# Patient Record
Sex: Female | Born: 1975 | Race: White | Hispanic: No | Marital: Married | Smoking: Former smoker
Health system: Southern US, Community
[De-identification: ages and names within clinical notes are randomized; demographics above are authoritative.]

## PROBLEM LIST (undated history)

## (undated) DIAGNOSIS — U071 COVID-19: Secondary | ICD-10-CM

## (undated) DIAGNOSIS — I493 Ventricular premature depolarization: Secondary | ICD-10-CM

## (undated) DIAGNOSIS — N12 Tubulo-interstitial nephritis, not specified as acute or chronic: Secondary | ICD-10-CM

## (undated) HISTORY — DX: Tubulo-interstitial nephritis, not specified as acute or chronic: N12

## (undated) HISTORY — PX: OTHER SURGICAL HISTORY: SHX169

## (undated) HISTORY — DX: COVID-19: U07.1

## (undated) HISTORY — PX: CATARACT EXTRACTION: SUR2

---

## 1997-10-27 ENCOUNTER — Other Ambulatory Visit: Admission: RE | Admit: 1997-10-27 | Discharge: 1997-10-27 | Payer: Self-pay | Admitting: Obstetrics & Gynecology

## 2003-04-03 ENCOUNTER — Emergency Department (HOSPITAL_COMMUNITY): Admission: EM | Admit: 2003-04-03 | Discharge: 2003-04-03 | Payer: Self-pay | Admitting: Emergency Medicine

## 2007-01-20 ENCOUNTER — Ambulatory Visit (HOSPITAL_COMMUNITY): Admission: RE | Admit: 2007-01-20 | Discharge: 2007-01-20 | Payer: Self-pay | Admitting: Family Medicine

## 2007-06-26 ENCOUNTER — Ambulatory Visit (HOSPITAL_COMMUNITY): Admission: RE | Admit: 2007-06-26 | Discharge: 2007-06-26 | Payer: Self-pay | Admitting: Family Medicine

## 2013-12-21 ENCOUNTER — Ambulatory Visit (HOSPITAL_COMMUNITY)
Admission: RE | Admit: 2013-12-21 | Discharge: 2013-12-21 | Disposition: A | Discharge status: Home / Self Care | Payer: Managed Care, Other (non HMO) | Source: Ambulatory Visit | Attending: Family Medicine | Admitting: Family Medicine

## 2013-12-21 ENCOUNTER — Other Ambulatory Visit (HOSPITAL_COMMUNITY): Payer: Self-pay | Admitting: Family Medicine

## 2013-12-21 DIAGNOSIS — R059 Cough, unspecified: Secondary | ICD-10-CM

## 2013-12-21 DIAGNOSIS — R05 Cough: Secondary | ICD-10-CM

## 2013-12-21 DIAGNOSIS — R918 Other nonspecific abnormal finding of lung field: Secondary | ICD-10-CM | POA: Diagnosis not present

## 2013-12-21 DIAGNOSIS — R0602 Shortness of breath: Secondary | ICD-10-CM | POA: Diagnosis not present

## 2014-09-10 ENCOUNTER — Emergency Department (HOSPITAL_COMMUNITY)
Admission: EM | Admit: 2014-09-10 | Discharge: 2014-09-10 | Disposition: A | Discharge status: Home / Self Care | Payer: Managed Care, Other (non HMO) | Attending: Emergency Medicine | Admitting: Emergency Medicine

## 2014-09-10 ENCOUNTER — Encounter (HOSPITAL_COMMUNITY): Payer: Self-pay | Admitting: *Deleted

## 2014-09-10 ENCOUNTER — Emergency Department (HOSPITAL_COMMUNITY): Payer: Managed Care, Other (non HMO)

## 2014-09-10 DIAGNOSIS — N12 Tubulo-interstitial nephritis, not specified as acute or chronic: Secondary | ICD-10-CM | POA: Diagnosis not present

## 2014-09-10 DIAGNOSIS — R109 Unspecified abdominal pain: Secondary | ICD-10-CM

## 2014-09-10 DIAGNOSIS — Z720 Tobacco use: Secondary | ICD-10-CM | POA: Diagnosis not present

## 2014-09-10 DIAGNOSIS — R000 Tachycardia, unspecified: Secondary | ICD-10-CM | POA: Diagnosis not present

## 2014-09-10 DIAGNOSIS — R300 Dysuria: Secondary | ICD-10-CM | POA: Diagnosis present

## 2014-09-10 LAB — URINE MICROSCOPIC-ADD ON

## 2014-09-10 LAB — BASIC METABOLIC PANEL
ANION GAP: 13 (ref 5–15)
BUN: 7 mg/dL (ref 6–20)
CALCIUM: 8.4 mg/dL — AB (ref 8.9–10.3)
CO2: 20 mmol/L — ABNORMAL LOW (ref 22–32)
CREATININE: 0.95 mg/dL (ref 0.44–1.00)
Chloride: 99 mmol/L — ABNORMAL LOW (ref 101–111)
Glucose, Bld: 99 mg/dL (ref 65–99)
Potassium: 4 mmol/L (ref 3.5–5.1)
SODIUM: 132 mmol/L — AB (ref 135–145)

## 2014-09-10 LAB — CBC
HCT: 33.7 % — ABNORMAL LOW (ref 36.0–46.0)
Hemoglobin: 11.6 g/dL — ABNORMAL LOW (ref 12.0–15.0)
MCH: 31.7 pg (ref 26.0–34.0)
MCHC: 34.4 g/dL (ref 30.0–36.0)
MCV: 92.1 fL (ref 78.0–100.0)
PLATELETS: 260 10*3/uL (ref 150–400)
RBC: 3.66 MIL/uL — AB (ref 3.87–5.11)
RDW: 13.2 % (ref 11.5–15.5)
WBC: 14 10*3/uL — AB (ref 4.0–10.5)

## 2014-09-10 LAB — URINALYSIS, ROUTINE W REFLEX MICROSCOPIC
Bilirubin Urine: NEGATIVE
Glucose, UA: NEGATIVE mg/dL
KETONES UR: NEGATIVE mg/dL
NITRITE: POSITIVE — AB
PH: 6 (ref 5.0–8.0)
PROTEIN: 100 mg/dL — AB
Specific Gravity, Urine: 1.014 (ref 1.005–1.030)
Urobilinogen, UA: 0.2 mg/dL (ref 0.0–1.0)

## 2014-09-10 LAB — PREGNANCY, URINE: Preg Test, Ur: NEGATIVE

## 2014-09-10 LAB — I-STAT CG4 LACTIC ACID, ED
LACTIC ACID, VENOUS: 0.37 mmol/L — AB (ref 0.5–2.0)
Lactic Acid, Venous: 1.02 mmol/L (ref 0.5–2.0)

## 2014-09-10 LAB — I-STAT BETA HCG BLOOD, ED (MC, WL, AP ONLY): I-stat hCG, quantitative: 7.6 m[IU]/mL — ABNORMAL HIGH (ref <5)

## 2014-09-10 MED ORDER — ONDANSETRON HCL 4 MG/2ML IJ SOLN
4.0000 mg | Freq: Once | INTRAMUSCULAR | Status: DC
  Filled 2014-09-10: qty 2

## 2014-09-10 MED ORDER — OXYCODONE-ACETAMINOPHEN 5-325 MG PO TABS: ORAL_TABLET | ORAL | Status: DC

## 2014-09-10 MED ORDER — CEPHALEXIN 500 MG PO CAPS: 500.0000 mg | ORAL_CAPSULE | Freq: Four times a day (QID) | ORAL | Status: AC

## 2014-09-10 MED ORDER — DEXTROSE 5 % IV SOLN
1.0000 g | Freq: Once | INTRAVENOUS | Status: AC
  Administered 2014-09-10: 1 g via INTRAVENOUS
  Filled 2014-09-10: qty 10

## 2014-09-10 MED ORDER — ACETAMINOPHEN 325 MG PO TABS
650.0000 mg | ORAL_TABLET | Freq: Once | ORAL | Status: DC
  Filled 2014-09-10: qty 2

## 2014-09-10 MED ORDER — KETOROLAC TROMETHAMINE 30 MG/ML IJ SOLN
30.0000 mg | Freq: Once | INTRAMUSCULAR | Status: AC
  Administered 2014-09-10: 30 mg via INTRAVENOUS
  Filled 2014-09-10: qty 1

## 2014-09-10 MED ORDER — SODIUM CHLORIDE 0.9 % IV BOLUS (SEPSIS)
1000.0000 mL | Freq: Once | INTRAVENOUS | Status: AC
Start: 2014-09-10 — End: 2014-09-10
  Administered 2014-09-10: 1000 mL via INTRAVENOUS

## 2014-09-10 NOTE — ED Notes (Signed)
Patient states she has a HX of Kidney stones and states she passed one on Thursday. Patient states she thinks she may have another one. Patient states no pain at this time.

## 2014-09-10 NOTE — ED Notes (Signed)
Pt states intermittent L flank pain, chills and nausea since last week.

## 2014-09-10 NOTE — ED Provider Notes (Signed)
CSN: [id number]     Arrival date & time 09/10/14  1126 History   First MD Initiated Contact with Patient 09/10/14 1211     Chief Complaint  Patient presents with  . Flank Pain     (Consider location/radiation/quality/duration/timing/severity/associated sxs/prior Treatment) HPI   Blood pressure 133/71, pulse 124, temperature 98.9 F (37.2 C), temperature source Oral, resp. rate 16, height 5\' 6"  (1.676 m), weight 187 lb (84.823 kg), last menstrual period 08/28/2014, SpO2 100 %.  ADJOA Holly Hudson is a 39 y.o. female complaining of dysuria, dark and concentrated urine, foul-smelling urine onset 1 week ago which resolved spontaneously over the course of 3 days. She also has bilateral flank pain states that the right flank pain has improved 2 days ago and thinks she passed a kidney stone but the left-sided flank pain persists. She is taking ibuprofen for this with good relief. She denies fever, , nausea, vomiting, chest pain, palpitations, shortness of breath, diarrhea. States she has not required any intervention to passed her kidney stones in the past and does not follow with urology. Endorses chills on review of systems.    History reviewed. No pertinent past medical history. History reviewed. No pertinent past surgical history. No family history on file. Social History  Substance Use Topics  . Smoking status: Current Every Day Smoker -- 2.00 packs/day    Types: Cigarettes  . Smokeless tobacco: None  . Alcohol Use: No   OB History    No data available     Review of Systems  10 systems reviewed and found to be negative, except as noted in the HPI.   Allergies  Levaquin  Home Medications   Prior to Admission medications   Medication Sig Start Date End Date Taking? Authorizing Provider  cephALEXin (KEFLEX) 500 MG capsule Take 1 capsule (500 mg total) by mouth 4 (four) times daily. 09/10/14 10/15/14  Ashaad Gaertner, PA-C  oxyCODONE-acetaminophen (PERCOCET/ROXICET) 5-325 MG  per tablet 1 to 2 tabs PO q6hrs  PRN for pain 09/10/14   Daffney Greenly, PA-C   BP 101/75 mmHg  Pulse 99  Temp(Src) 98.9 F (37.2 C) (Oral)  Resp 18  Ht 5\' 6"  (1.676 m)  Wt 187 lb (84.823 kg)  BMI 30.20 kg/m2  SpO2 95%  LMP 08/28/2014 Physical Exam  Constitutional: She is oriented to person, place, and time. She appears well-developed and well-nourished. No distress.  HENT:  Head: Normocephalic and atraumatic.  Mouth/Throat: Oropharynx is clear and moist.  Eyes: Conjunctivae and EOM are normal. Pupils are equal, round, and reactive to light.  Neck: Normal range of motion. No JVD present. No tracheal deviation present.  Cardiovascular: Normal rate, regular rhythm and intact distal pulses.   Tachycardic  Pulmonary/Chest: Effort normal and breath sounds normal. No stridor. No respiratory distress. She has no wheezes. She has no rales. She exhibits no tenderness.  Abdominal: Soft. She exhibits no distension and no mass. There is tenderness. There is no rebound and no guarding.  Mild, diffuse tenderness to palpation with no guarding or rebound.  Murphy sign negative, no tenderness to palpation over McBurney's point, Rovsings, Psoas and obturator all negative.   Genitourinary:  Mild CVA tenderness to percussion on the right  Musculoskeletal: Normal range of motion. She exhibits no edema or tenderness.  No calf asymmetry, superficial collaterals, palpable cords, edema, Homans sign negative bilaterally.    Neurological: She is alert and oriented to person, place, and time.  Skin: Skin is warm. She is not diaphoretic.  Psychiatric:  She has a normal mood and affect.  Nursing note and vitals reviewed.   ED Course  Procedures (including critical care time)  EMERGENCY DEPARTMENT US RENAL EXAM  "Study: Limited Retroperitoneal Ultrasound of Kidneys"  INDICATIONS: Flank pain  Long and short axis of both kidneys were obtained.   PERFORMED BY: Myself  IMAGES ARCHIVED?:  Yes  LIMITATIONS: Body habitus  VIEWS USED: Long axis and Short axis   INTERPRETATION: Left  Hydronephrosis moderate   CPT Code: 4581215297 (limited retroperitoneal)    Labs Review Labs Reviewed  URINALYSIS, ROUTINE W REFLEX MICROSCOPIC (NOT AT [facility]) - Abnormal; Notable for the following:    APPearance TURBID (*)    Hgb urine dipstick MODERATE (*)    Protein, ur 100 (*)    Nitrite POSITIVE (*)    Leukocytes, UA LARGE (*)    All other components within normal limits  CBC - Abnormal; Notable for the following:    WBC 14.0 (*)    RBC 3.66 (*)    Hemoglobin 11.6 (*)    HCT 33.7 (*)    All other components within normal limits  BASIC METABOLIC PANEL - Abnormal; Notable for the following:    Sodium 132 (*)    Chloride 99 (*)    CO2 20 (*)    Calcium 8.4 (*)    All other components within normal limits  I-STAT BETA HCG BLOOD, ED ([facility], [facility], [facility] ONLY) - Abnormal; Notable for the following:    I-stat hCG, quantitative 7.6 (*)    All other components within normal limits  I-STAT CG4 LACTIC ACID, ED - Abnormal; Notable for the following:    Lactic Acid, Venous 0.37 (*)    All other components within normal limits  URINE CULTURE  URINE MICROSCOPIC-ADD ON  PREGNANCY, URINE  I-STAT CG4 LACTIC ACID, ED    Imaging Review Ct Renal Stone Study  09/10/2014   CLINICAL DATA:  Acute onset of bilateral flank pain, left greater than right. Current history of urinary tract calculi. Report  EXAM: CT ABDOMEN AND PELVIS WITHOUT CONTRAST  TECHNIQUE: Multidetector CT imaging of the abdomen and pelvis was performed following the standard protocol without IV contrast.  COMPARISON:  None.  FINDINGS: Hepatobiliary: Liver normal in size and appearance for the unenhanced technique. Normal-appearing gallbladder without calcified gallstones. No biliary ductal dilation.  Spleen: Upper normal in size measuring approximately 14.3 x 4.3 x 12.9 cm yielding a volume of 397 ml. No focal splenic parenchymal abnormality.  Accessory splenic tissue medial to the spleen just below the hilum.  Pancreas:  Normal in appearance.  No pancreatic ductal dilation.  Adrenal glands:  Normal in appearance.  Genitourinary: Mild bilateral hydroureteronephrosis, though both ureters can be followed to the bladder without evidence of an obstructing stone or mass. Perinephric edema surrounding the right kidney and right ureter. No focal parenchymal abnormality involving either kidney, allowing for the unenhanced technique. No urinary tract calculi on either side. Urinary bladder normal in appearance.  Normal-appearing uterus and ovaries without evidence of adnexal mass or free pelvic fluid.  Gastrointestinal: Stomach normal in appearance for degree of distention. Normal-appearing small bowel. Normal appearing colon with expected stool burden. Normal appendix in the right upper pelvis, though appendicoliths are noted within the distal appendix.  Ascites:  Absent.  Vascular:  Calcified plaque involving the distal abdominal aorta.  Lymphatic:  No pathologic lymphadenopathy in the abdomen or pelvis.  Other findings: Very small umbilical hernia containing fat.  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  Visualized lower thorax: Heart size normal.  Lung bases clear.  IMPRESSION: 1. Mild bilateral hydroureteronephrosis. No urinary tract calculi on either side. Specifically, no obstructing stones within either ureter. 2. Edema surrounding the right kidney and the right ureter along its course. Is there clinical evidence of urinary tract infection? 3. Minimal distal abdominal aortic atherosclerosis which is somewhat advanced for age.   Electronically Signed   By: Hulan Saas M.D.   On: 09/10/2014 15:47   I have personally reviewed and evaluated these images and lab results as part of my medical decision-making.   EKG Interpretation   Date/Time:  Saturday September 10 2014 12:55:26 EDT Ventricular Rate:  117 PR  Interval:  131 QRS Duration: 79 QT Interval:  341 QTC Calculation: 476 R Axis:   95 Text Interpretation:  Sinus tachycardia Borderline right axis deviation  Borderline abnrm T, anterolateral leads Baseline wander in lead(s) V3 V4  V5 V6 No previous ECGs available Confirmed by YAO  MD, DAVID ([id number]) on  09/10/2014 1:16:06 PM      MDM   Final diagnoses:  Left flank pain  Pyelonephritis    Filed Vitals:   09/10/14 1630 09/10/14 1700 09/10/14 1715 09/10/14 1730  BP: 120/97 101/53 106/59 101/75  Pulse: 107 100 98 99  Temp:      TempSrc:      Resp: 18     Height:      Weight:      SpO2: 98% 95% 96% 95%    Medications  ondansetron (ZOFRAN) injection 4 mg (0 mg Intravenous Hold 09/10/14 1256)  acetaminophen (TYLENOL) tablet 650 mg (0 mg Oral Hold 09/10/14 1644)  sodium chloride 0.9 % bolus 1,000 mL (0 mLs Intravenous Stopped 09/10/14 1536)  cefTRIAXone (ROCEPHIN) 1 g in dextrose 5 % 50 mL IVPB (0 g Intravenous Stopped 09/10/14 1506)  ketorolac (TORADOL) 30 MG/ML injection 30 mg (30 mg Intravenous Given 09/10/14 1642)    AVIENDHA AZBELL is a pleasant 39 y.o. female presenting with resolved UTI systems and bilateral flank pain, has history of kidney stones however she appears very comfortable. Patient is tachycardic. Afebrile and overall well appearing.  Urinalysis is consistent with infection, considering patient's history of kidney stones will obtain CT for formal evaluation.  No stone is seen. Patient will be treated for pyelonephritis. We've had an extensive discussion of return precautions patient verbalizes her understanding.  Evaluation does not show pathology that would require ongoing emergent intervention or inpatient treatment. Pt is hemodynamically stable and mentating appropriately. Discussed findings and plan with patient/guardian, who agrees with care plan. All questions answered. Return precautions discussed and outpatient follow up given.   Discharge Medication  List as of 09/10/2014  5:31 PM    START taking these medications   Details  cephALEXin (KEFLEX) 500 MG capsule Take 1 capsule (500 mg total) by mouth 4 (four) times daily., Starting 09/10/2014, Until Sat 10/15/14, Print    oxyCODONE-acetaminophen (PERCOCET/ROXICET) 5-325 MG per tablet 1 to 2 tabs PO q6hrs  PRN for pain, Print             Wynetta Emery, PA-C 09/10/14 1855  Richardean Canal, MD 09/11/14 [phone]

## 2014-09-10 NOTE — Discharge Instructions (Signed)
°  Take your antibiotics as directed and to completion. You should never have any leftover antibiotics! Push fluids and stay well hydrated.  ° °Any antibiotic use can reduce the efficacy of hormonal birth control. Please use back up method of contraception.  ° °Take percocet for breakthrough pain, do not drink alcohol, drive, care for children or do other critical tasks while taking percocet. ° °Please follow with your primary care doctor in the next 2 days for a check-up. They must obtain records for further management.  ° °Do not hesitate to return to the Emergency Department for any new, worsening or concerning symptoms.  ° ° °

## 2014-09-12 LAB — URINE CULTURE: Culture: >=100000

## 2014-09-13 ENCOUNTER — Telehealth (HOSPITAL_COMMUNITY): Payer: Self-pay | Admitting: Emergency Medicine

## 2014-09-13 NOTE — Telephone Encounter (Signed)
Post ED Visit - Positive Culture Follow-up  Culture report reviewed by antimicrobial stewardship pharmacist:  Casilda Carls, Pharm.D., BCPS  Celedonio Miyamoto, Pharm.D., BCPS  Georgina Pillion, [address].D., BCPS  Many, [place].D., BCPS, AAHIVP  Estella Husk, Pharm.D., BCPS, AAHIVP  Elder Cyphers, [address].D., BCPS  Positive Urine culture Treated with Cephalexin, organism sensitive to the same and no further patient follow-up is required at this time.  Jiles Harold 09/13/2014, 6:33 PM

## 2014-09-14 ENCOUNTER — Other Ambulatory Visit (HOSPITAL_COMMUNITY): Payer: Self-pay | Admitting: Family Medicine

## 2014-09-14 DIAGNOSIS — D72829 Elevated white blood cell count, unspecified: Secondary | ICD-10-CM

## 2015-05-04 ENCOUNTER — Emergency Department (HOSPITAL_COMMUNITY): Payer: Managed Care, Other (non HMO)

## 2015-05-04 ENCOUNTER — Emergency Department (HOSPITAL_COMMUNITY)
Admission: EM | Admit: 2015-05-04 | Discharge: 2015-05-04 | Disposition: A | Discharge status: Home / Self Care | Payer: Managed Care, Other (non HMO) | Attending: Emergency Medicine | Admitting: Emergency Medicine

## 2015-05-04 ENCOUNTER — Encounter (HOSPITAL_COMMUNITY): Payer: Self-pay

## 2015-05-04 DIAGNOSIS — Z7982 Long term (current) use of aspirin: Secondary | ICD-10-CM | POA: Diagnosis not present

## 2015-05-04 DIAGNOSIS — Z79899 Other long term (current) drug therapy: Secondary | ICD-10-CM | POA: Insufficient documentation

## 2015-05-04 DIAGNOSIS — R072 Precordial pain: Secondary | ICD-10-CM | POA: Insufficient documentation

## 2015-05-04 DIAGNOSIS — R110 Nausea: Secondary | ICD-10-CM | POA: Diagnosis not present

## 2015-05-04 DIAGNOSIS — F17210 Nicotine dependence, cigarettes, uncomplicated: Secondary | ICD-10-CM | POA: Diagnosis not present

## 2015-05-04 DIAGNOSIS — R079 Chest pain, unspecified: Secondary | ICD-10-CM

## 2015-05-04 HISTORY — DX: Ventricular premature depolarization: I49.3

## 2015-05-04 LAB — BASIC METABOLIC PANEL
Anion gap: 9 (ref 5–15)
BUN: 7 mg/dL (ref 6–20)
CALCIUM: 8.4 mg/dL — AB (ref 8.9–10.3)
CO2: 21 mmol/L — ABNORMAL LOW (ref 22–32)
Chloride: 107 mmol/L (ref 101–111)
Creatinine, Ser: 0.74 mg/dL (ref 0.44–1.00)
GFR calc Af Amer: >60 mL/min (ref >60)
GLUCOSE: 115 mg/dL — AB (ref 65–99)
Potassium: 3.5 mmol/L (ref 3.5–5.1)
SODIUM: 137 mmol/L (ref 135–145)

## 2015-05-04 LAB — CBC WITH DIFFERENTIAL/PLATELET
BASOS ABS: 0 10*3/uL (ref 0.0–0.1)
Basophils Relative: 0 %
EOS ABS: 0.1 10*3/uL (ref 0.0–0.7)
EOS PCT: 1 %
HCT: 34.5 % — ABNORMAL LOW (ref 36.0–46.0)
Hemoglobin: 12 g/dL (ref 12.0–15.0)
LYMPHS ABS: 3.6 10*3/uL (ref 0.7–4.0)
LYMPHS PCT: 35 %
MCH: 31.9 pg (ref 26.0–34.0)
MCHC: 34.8 g/dL (ref 30.0–36.0)
MCV: 91.8 fL (ref 78.0–100.0)
MONO ABS: 0.5 10*3/uL (ref 0.1–1.0)
Monocytes Relative: 4 %
Neutro Abs: 6.2 10*3/uL (ref 1.7–7.7)
Neutrophils Relative %: 60 %
PLATELETS: 264 10*3/uL (ref 150–400)
RBC: 3.76 MIL/uL — ABNORMAL LOW (ref 3.87–5.11)
RDW: 13.4 % (ref 11.5–15.5)
WBC: 10.3 10*3/uL (ref 4.0–10.5)

## 2015-05-04 LAB — TROPONIN I
Troponin I: <0.03 ng/mL (ref <0.031)
Troponin I: <0.03 ng/mL (ref <0.031)

## 2015-05-04 LAB — D-DIMER, QUANTITATIVE: D-Dimer, Quant: 0.4 ug/mL-FEU (ref 0.00–0.50)

## 2015-05-04 NOTE — Discharge Instructions (Signed)
Workup for the chest pain without any acute findings. Make appointment to follow-up with your record Dr. Return for any new or worse symptoms.

## 2015-05-04 NOTE — ED Provider Notes (Signed)
CSN: [id number]     Arrival date & time 05/04/15  1539 History   First MD Initiated Contact with Patient 05/04/15 1557     Chief Complaint  Patient presents with  . Chest Pain     (Consider location/radiation/quality/duration/timing/severity/associated sxs/prior Treatment) Patient is a 40 y.o. female presenting with chest pain. The history is provided by the patient.  Chest Pain Associated symptoms: nausea and shortness of breath   Associated symptoms: no abdominal pain, no back pain, no fever and no headache    patient was at work had acute onset of chest pain sternal area described more as tightness. Now almost completely gone. Started at 2:00 in the afternoon. Oxygen saturation on room air 98%. Associated with nausea shortness of breath no vomiting. No known history of any acute cardiac problems other than a history of premature ventricular contractions and a history of a slightly leaky heart valve.  Past Medical History  Diagnosis Date  . PVC (premature ventricular contraction)    History reviewed. No pertinent past surgical history. No family history on file. Social History  Substance Use Topics  . Smoking status: Current Every Day Smoker -- 2.00 packs/day    Types: Cigarettes  . Smokeless tobacco: None  . Alcohol Use: No   OB History    No data available     Review of Systems  Constitutional: Negative for fever.  HENT: Negative for congestion.   Eyes: Negative for visual disturbance.  Respiratory: Positive for shortness of breath.   Cardiovascular: Positive for chest pain.  Gastrointestinal: Positive for nausea. Negative for abdominal pain.  Genitourinary: Negative for dysuria.  Musculoskeletal: Negative for back pain.  Skin: Negative for rash.  Neurological: Negative for headaches.  Hematological: Does not bruise/bleed easily.  Psychiatric/Behavioral: Negative for confusion.      Allergies  Levaquin  Home Medications   Prior to Admission medications    Medication Sig Start Date End Date Taking? Authorizing Provider  aspirin EC 81 MG tablet Take 162 mg by mouth once as needed for mild pain or moderate pain.   Yes Historical Provider, MD  Multiple Vitamin (MULTIVITAMIN WITH MINERALS) TABS tablet Take 1 tablet by mouth daily.   Yes Historical Provider, MD  SALINE NASAL MIST NA Place 1 spray into the nose daily as needed (for congestion).   Yes Historical Provider, MD   BP 128/85 mmHg  Pulse 91  Temp(Src) 98.1 F (36.7 C) (Oral)  Resp 16  Ht 5\' 6"  (1.676 m)  Wt 86.183 kg  BMI 30.68 kg/m2  SpO2 97%  LMP 04/20/2015 Physical Exam  Constitutional: She is oriented to person, place, and time. She appears well-developed and well-nourished. No distress.  HENT:  Head: Normocephalic and atraumatic.  Mouth/Throat: Oropharynx is clear and moist.  Eyes: Conjunctivae and EOM are normal. Pupils are equal, round, and reactive to light.  Neck: Normal range of motion. Neck supple.  Cardiovascular: Normal rate, regular rhythm and normal heart sounds.   No murmur heard. Pulmonary/Chest: Effort normal and breath sounds normal. No respiratory distress.  Abdominal: Soft. Bowel sounds are normal. There is no tenderness.  Musculoskeletal: Normal range of motion. She exhibits no edema.  Neurological: She is alert and oriented to person, place, and time. No cranial nerve deficit. She exhibits normal muscle tone. Coordination normal.  Skin: Skin is warm. No rash noted.    ED Course  Procedures (including critical care time) Labs Review Labs Reviewed  CBC WITH DIFFERENTIAL/PLATELET - Abnormal; Notable for the following:  RBC 3.76 (*)    HCT 34.5 (*)    All other components within normal limits  BASIC METABOLIC PANEL - Abnormal; Notable for the following:    CO2 21 (*)    Glucose, Bld 115 (*)    Calcium 8.4 (*)    All other components within normal limits  TROPONIN I  D-DIMER, QUANTITATIVE (NOT AT [facility])  TROPONIN I    Imaging Review Dg Chest  Portable 1 View  05/04/2015  CLINICAL DATA:  Chest pain, shortness of Breath EXAM: PORTABLE CHEST 1 VIEW COMPARISON:  12/21/2013 FINDINGS: Cardiomediastinal silhouette is unremarkable. No acute infiltrate or pleural effusion. No pulmonary edema. Bony thorax is unremarkable. IMPRESSION: No active disease. Electronically Signed   By: Natasha Mead M.D.   On: 05/04/2015 16:07   I have personally reviewed and evaluated these images and lab results as part of my medical decision-making.   EKG Interpretation   Date/Time:  Thursday May 04 2015 15:48:26 EDT Ventricular Rate:  103 PR Interval:  138 QRS Duration: 98 QT Interval:  336 QTC Calculation: 440 R Axis:   86 Text Interpretation:  Sinus tachycardia Borderline T abnormalities,  anterior leads Baseline wander in lead(s) V6 No significant change since  last tracing Confirmed by Dagoberto Nealy  MD, Jos Cygan [phone]) on 05/04/2015  4:04:56 PM      MDM   Final diagnoses:  Chest pain, unspecified chest pain type    Workup for the chest pain without any acute findings. Troponins 2 are negative. Chest x-ray negative for pneumonia pneumothorax or pulmonary edema. D-dimer also negative no real concerns for pulmonary embolus. Patient's symptoms have improved significantly. Will discharge home with follow-up with her regular doctor.  Patient's cardiac score score shows low probability.   Vanetta Mulders, MD 05/04/15 [phone]

## 2015-05-04 NOTE — ED Notes (Signed)
Pt states understanding of care given and follow up instructions.  Ambulated from ED  

## 2015-05-04 NOTE — ED Notes (Signed)
Pt reports chest pain, sob, and feeling like heart racing for the past hour.  Reports was at work walking around when it started.  Denies doing anything strenuous but was feeling " a little stressed."

## 2018-10-14 ENCOUNTER — Other Ambulatory Visit: Payer: Self-pay

## 2018-10-14 DIAGNOSIS — Z20822 Contact with and (suspected) exposure to covid-19: Secondary | ICD-10-CM

## 2018-10-16 LAB — NOVEL CORONAVIRUS, NAA: SARS-CoV-2, NAA: DETECTED — AB

## 2019-04-03 ENCOUNTER — Ambulatory Visit
Admission: EM | Admit: 2019-04-03 | Discharge: 2019-04-03 | Disposition: A | Discharge status: Home / Self Care | Payer: Commercial Managed Care - PPO | Attending: Emergency Medicine | Admitting: Emergency Medicine

## 2019-04-03 ENCOUNTER — Other Ambulatory Visit: Payer: Self-pay

## 2019-04-03 DIAGNOSIS — N3001 Acute cystitis with hematuria: Secondary | ICD-10-CM

## 2019-04-03 LAB — POCT URINALYSIS DIP (MANUAL ENTRY)
Bilirubin, UA: NEGATIVE
Glucose, UA: NEGATIVE mg/dL
Ketones, POC UA: NEGATIVE mg/dL
Nitrite, UA: NEGATIVE
Spec Grav, UA: 1.01 (ref 1.010–1.025)
Urobilinogen, UA: 0.2 E.U./dL
pH, UA: 6.5 (ref 5.0–8.0)

## 2019-04-03 MED ORDER — PHENAZOPYRIDINE HCL 100 MG PO TABS: 100.0000 mg | ORAL_TABLET | Freq: Three times a day (TID) | ORAL | 0 refills | Status: DC | PRN

## 2019-04-03 MED ORDER — NITROFURANTOIN MONOHYD MACRO 100 MG PO CAPS: 100.0000 mg | ORAL_CAPSULE | Freq: Two times a day (BID) | ORAL | 0 refills | Status: DC

## 2019-04-03 NOTE — ED Provider Notes (Signed)
[facility]   CC: Burning with urination  SUBJECTIVE:  Holly Hudson is a 44 y.o. female who complains of urinary frequency, urgency and dysuria for the past 2 days days.  Patient denies a precipitating event, recent sexual encounter, excessive caffeine intake.  Localizes the mild pain to her back.  Pain is intermittent and describes it as achy.  Has tried OTC medications without relief.  Symptoms are made worse with urination.  Admits to similar symptoms in the past.  Denies fever, chills, nausea, vomiting, abdominal pain, flank pain, abnormal vaginal discharge or bleeding, hematuria.    LMP: Patient's last menstrual period was 03/14/2019.  ROS: As in HPI.  All other pertinent ROS negative.     Past Medical History:  Diagnosis Date  . PVC (premature ventricular contraction)    History reviewed. No pertinent surgical history. Allergies  Allergen Reactions  . Levaquin [Levofloxacin In D5w] Other (See Comments)    Joint/muscle pain   No current facility-administered medications on file prior to encounter.   Current Outpatient Medications on File Prior to Encounter  Medication Sig Dispense Refill  . aspirin EC 81 MG tablet Take 162 mg by mouth once as needed for mild pain or moderate pain.    . Multiple Vitamin (MULTIVITAMIN WITH MINERALS) TABS tablet Take 1 tablet by mouth daily.    Marland Kitchen SALINE NASAL MIST NA Place 1 spray into the nose daily as needed (for congestion).     Social History   Socioeconomic History  . Marital status: Married    Spouse name: Not on file  . Number of children: Not on file  . Years of education: Not on file  . Highest education level: Not on file  Occupational History  . Not on file  Tobacco Use  . Smoking status: Current Every Day Smoker    Packs/day: 1.50    Types: Cigarettes  . Smokeless tobacco: Never Used  Substance and Sexual Activity  . Alcohol use: Yes    Comment: occ  . Drug use: No  . Sexual activity: Yes    Birth  control/protection: None  Other Topics Concern  . Not on file  Social History Narrative  . Not on file   Social Determinants of Health   Financial Resource Strain:   . Difficulty of Paying Living Expenses:   Food Insecurity:   . Worried About Charity fundraiser in the Last Year:   . Arboriculturist in the Last Year:   Transportation Needs:   . Film/video editor (Medical):   Marland Kitchen Lack of Transportation (Non-Medical):   Physical Activity:   . Days of Exercise per Week:   . Minutes of Exercise per Session:   Stress:   . Feeling of Stress :   Social Connections:   . Frequency of Communication with Friends and Family:   . Frequency of Social Gatherings with Friends and Family:   . Attends Religious Services:   . Active Member of Clubs or Organizations:   . Attends Archivist Meetings:   Marland Kitchen Marital Status:   Intimate Partner Violence:   . Fear of Current or Ex-Partner:   . Emotionally Abused:   Marland Kitchen Physically Abused:   . Sexually Abused:    Family History  Problem Relation Age of Onset  . Healthy Mother   . Healthy Father     OBJECTIVE:  Vitals:   04/03/19 0850  BP: 113/76  Pulse: (!) 103  Resp: 17  Temp: 97.9  F (36.6 C)  TempSrc: Oral  SpO2: 98%   General appearance: AOx3 in no acute distress HEENT: NCAT.  Oropharynx clear.  Lungs: clear to auscultation bilaterally without adventitious breath sounds Heart: Tachycardia and normal rhythm.  Radial pulses 2+ symmetrical bilaterally Abdomen: soft; non-distended; no tenderness; bowel sounds present; no guarding or rebound tenderness Back: no CVA tenderness Extremities: no edema; symmetrical with no gross deformities Skin: warm and dry Neurologic: Ambulates from chair to exam table without difficulty Psychological: alert and cooperative; normal mood and affect  Labs Reviewed  POCT URINALYSIS DIP (MANUAL ENTRY) - Abnormal; Notable for the following components:      Result Value   Clarity, UA cloudy (*)     Blood, UA large (*)    Protein Ur, POC trace (*)    Leukocytes, UA Large (3+) (*)    All other components within normal limits    ASSESSMENT & PLAN:  1. Acute cystitis with hematuria     Meds ordered this encounter  Medications  . nitrofurantoin, macrocrystal-monohydrate, (MACROBID) 100 MG capsule    Sig: Take 1 capsule (100 mg total) by mouth 2 (two) times daily.    Dispense:  10 capsule    Refill:  0  . phenazopyridine (PYRIDIUM) 100 MG tablet    Sig: Take 1 tablet (100 mg total) by mouth 3 (three) times daily as needed for pain.    Dispense:  10 tablet    Refill:  0   Patient stable at discharge. POCT urine analysis show large amount of blood with large amount of leukocyte suggesting possible UTI.  Discharge instruction Urine culture sent.  We will call you with the results.   Push fluids and get plenty of rest.   Take antibiotic as directed and to completion Take pyridium as prescribed and as needed for symptomatic relief Follow up with PCP if symptoms persists Return here or go to ER if you have any new or worsening symptoms such as fever, worsening abdominal pain, nausea/vomiting, flank pain, etc...  Outlined signs and symptoms indicating need for more acute intervention. Patient verbalized understanding. After Visit Summary given.     Durward Parcel, FNP 04/03/19 [phone]

## 2019-04-03 NOTE — ED Triage Notes (Signed)
Patient states that she has dysuria, urgency with urination x 2 days, hx of uti

## 2019-04-03 NOTE — Discharge Instructions (Signed)
Urine culture sent.  We will call you with the results.   Push fluids and get plenty of rest.   Take antibiotic as directed and to completion Take pyridium as prescribed and as needed for symptomatic relief Follow up with PCP if symptoms persists Return here or go to ER if you have any new or worsening symptoms such as fever, worsening abdominal pain, nausea/vomiting, flank pain, etc...  Outlined signs and symptoms indicating need for more acute intervention. Patient verbalized understanding. After Visit Summary given.  

## 2019-11-19 ENCOUNTER — Telehealth: Payer: Self-pay | Admitting: *Deleted

## 2019-11-19 NOTE — Telephone Encounter (Signed)
NOTES ON FILE FROM [facility] [phone]. REFERRAL SENT TO SCHEDULING.

## 2020-02-09 ENCOUNTER — Ambulatory Visit: Payer: Commercial Managed Care - PPO | Admitting: Cardiology

## 2020-03-24 ENCOUNTER — Encounter: Payer: Self-pay | Admitting: Cardiology

## 2020-03-24 NOTE — Progress Notes (Signed)
Cardiology Office Note  Date: 03/27/2020   ID: Holly Hudson, DOB [birth year], MRN [id number]  PCP:  Edsel Petrin, DO  Cardiologist:  Nona Dell, MD Electrophysiologist:  None   Chief Complaint  Patient presents with  . Cardiac evaluation    History of Present Illness: Holly Hudson is a 45 y.o. female referred for cardiology consultation by Ms.Chrzanowski NP for cardiac evaluation with family history of heart disease.  She does not report any active exertional symptoms, no unusual shortness of breath or chest pain, no palpitations or syncope.  She did have COVID-19 in September 2020 although has recuperated without obvious incident.  She does have a family history of early cardiac disease as outlined below.  She states that lipids have been followed by her gynecologist, it also sounds like CRP levels have been checked and within normal range.  At present I do not have access to her outside lab work.  I personally reviewed her ECG today which shows normal sinus rhythm.  Past Medical History:  Diagnosis Date  . COVID-10 October 2018  . Pyelonephritis     Past Surgical History:  Procedure Laterality Date  . No prior surgery      Current Outpatient Medications  Medication Sig Dispense Refill  . aspirin EC 81 MG tablet Take 162 mg by mouth once as needed for mild pain or moderate pain.    . Multiple Vitamin (MULTIVITAMIN WITH MINERALS) TABS tablet Take 1 tablet by mouth daily.     No current facility-administered medications for this visit.   Allergies:  Levaquin [levofloxacin in d5w]   Social History: The patient  reports that she has quit smoking. Her smoking use included cigarettes. She has never used smokeless tobacco. She reports previous alcohol use. She reports that she does not use drugs.   Family History: The patient's family history includes COPD in her father; Diabetes Mellitus II in her mother; Heart disease in her maternal  grandfather, maternal grandmother, and mother; Hypertension in her father, maternal grandfather, maternal grandmother, and mother; Lung cancer in her father.   ROS: No palpitations or syncope.  Physical Exam: VS:  BP 110/78   Pulse 86   Ht 5\' 6"  (1.676 m)   Wt 191 lb (86.6 kg)   SpO2 96%   BMI 30.83 kg/m , BMI Body mass index is 30.83 kg/m.  Wt Readings from Last 3 Encounters:  03/27/20 191 lb (86.6 kg)  05/04/15 190 lb (86.2 kg)  09/10/14 187 lb (84.8 kg)    General: Patient appears comfortable at rest. HEENT: Conjunctiva and lids normal, wearing a mask. Neck: Supple, no elevated JVP or carotid bruits, no thyromegaly. Lungs: Clear to auscultation, nonlabored breathing at rest. Cardiac: Regular rate and rhythm, no S3 or significant systolic murmur, no pericardial rub. Abdomen: Soft, nontender, bowel sounds present. Extremities: No pitting edema, distal pulses 2+. Skin: Warm and dry. Musculoskeletal: No kyphosis. Neuropsychiatric: Alert and oriented x3, affect grossly appropriate.  ECG:  An ECG dated 05/04/2015 was personally reviewed today and demonstrated:  Sinus tachycardia with nonspecific T wave changes.  Recent Labwork:  No recent lab work for review today.  Other Studies Reviewed Today:  Chest x-ray 05/04/2015: FINDINGS: Cardiomediastinal silhouette is unremarkable. No acute infiltrate or pleural effusion. No pulmonary edema. Bony thorax is unremarkable.  IMPRESSION: No active disease.  Assessment and Plan:  Cardiac risk evaluation in a 45 year old woman with family history of early heart disease.  She reports no active  exertional symptomatology and baseline ECG is normal.  Plan to request her fasting lipid profile from gynecologist for review.  We will order a coronary calcium score as well.  She quit smoking 3 years ago and I congratulated her on this.  Blood pressure is normal today as well.  Plan to review test results and make further  recommendations.  Medication Adjustments/Labs and Tests Ordered: Current medicines are reviewed at length with the patient today.  Concerns regarding medicines are outlined above.   Tests Ordered: Orders Placed This Encounter  Procedures  . CT CARDIAC SCORING (SELF PAY ONLY)  . EKG 12-Lead    Medication Changes: No orders of the defined types were placed in this encounter.   Disposition:  Follow up test results.  Signed, Jonelle Sidle, MD, [facility] 03/27/2020 3:23 PM    [facility] [address] [place], [place] [address] Phone: [phone]; Fax: [phone]

## 2020-03-27 ENCOUNTER — Encounter: Payer: Self-pay | Admitting: Cardiology

## 2020-03-27 ENCOUNTER — Encounter: Payer: Self-pay | Admitting: *Deleted

## 2020-03-27 ENCOUNTER — Ambulatory Visit (INDEPENDENT_AMBULATORY_CARE_PROVIDER_SITE_OTHER): Payer: Commercial Managed Care - PPO | Admitting: Cardiology

## 2020-03-27 ENCOUNTER — Other Ambulatory Visit: Payer: Self-pay

## 2020-03-27 VITALS — BP 110/78 | HR 86 | Ht 66.0 in | Wt 191.0 lb

## 2020-03-27 DIAGNOSIS — Z8249 Family history of ischemic heart disease and other diseases of the circulatory system: Secondary | ICD-10-CM

## 2020-03-27 NOTE — Patient Instructions (Signed)
Medication Instructions:  Your physician recommends that you continue on your current medications as directed. Please refer to the Current Medication list given to you today.  *If you need a refill on your cardiac medications before your next appointment, please call your pharmacy*   Lab Work: NONE   If you have labs (blood work) drawn today and your tests are completely normal, you will receive your results only by: Marland Kitchen MyChart Message (if you have MyChart) OR . A paper copy in the mail If you have any lab test that is abnormal or we need to change your treatment, we will call you to review the results.   Testing/Procedures: Calcium Scoring    Follow-Up: At [facility], you and your health needs are our priority.  As part of our continuing mission to provide you with exceptional heart care, we have created designated Provider Care Teams.  These Care Teams include your primary Cardiologist (physician) and Advanced Practice Providers (APPs -  Physician Assistants and Nurse Practitioners) who all work together to provide you with the care you need, when you need it.  We recommend signing up for the patient portal called "MyChart".  Sign up information is provided on this After Visit Summary.  MyChart is used to connect with patients for Virtual Visits (Telemedicine).  Patients are able to view lab/test results, encounter notes, upcoming appointments, etc.  Non-urgent messages can be sent to your provider as well.   To learn more about what you can do with MyChart, go to [email or web address].    Your next appointment:    Pending test results   The format for your next appointment:   In Person  Provider:   Nona Dell, MD   Other Instructions Thank you for choosing [facility]!

## 2020-04-04 ENCOUNTER — Telehealth: Payer: Self-pay | Admitting: *Deleted

## 2020-04-04 NOTE — Telephone Encounter (Signed)
Received fax from [facility] stating that they will not release recent pt labs without a release of information. Pt called and asked to stop by office to sign. Pt states she would stop by on tomorrow. Phone [phone] Fax: [phone].

## 2020-04-13 ENCOUNTER — Inpatient Hospital Stay: Admission: RE | Admit: 2020-04-13 | Payer: Self-pay | Source: Ambulatory Visit

## 2020-04-20 ENCOUNTER — Ambulatory Visit (INDEPENDENT_AMBULATORY_CARE_PROVIDER_SITE_OTHER)
Admission: RE | Admit: 2020-04-20 | Discharge: 2020-04-20 | Disposition: A | Discharge status: Home / Self Care | Payer: Self-pay | Source: Ambulatory Visit | Attending: Cardiology | Admitting: Cardiology

## 2020-04-20 ENCOUNTER — Other Ambulatory Visit: Payer: Self-pay

## 2020-04-20 DIAGNOSIS — Z8249 Family history of ischemic heart disease and other diseases of the circulatory system: Secondary | ICD-10-CM

## 2020-04-26 ENCOUNTER — Ambulatory Visit: Payer: Commercial Managed Care - PPO | Admitting: Student

## 2021-12-12 IMAGING — CT CT CARDIAC CORONARY ARTERY CALCIUM SCORE
3 series · 14 of 20 positions shown, 15 images · non-contrast
Comparison: 05/04/2015 chest radiograph.
COMPARISON: 05/04/2015 chest radiograph.

Addendum:
EXAM:
OVER-READ INTERPRETATION  CT CHEST

The following report is an over-read performed by radiologist Dr.
Heyber Restepo [REDACTED] on 04/20/2020. This over-read
does not include interpretation of cardiac or coronary anatomy or
pathology. The calcium score interpretation by the cardiologist is
attached.
CLINICAL DATA: Cardiovascular Disease Risk stratification
Coronary Calcium Score
TECHNIQUE: A gated, non-contrast computed tomography scan of the heart was
performed using 3mm slice thickness. Axial images were analyzed on a
dedicated workstation. Calcium scoring of the coronary arteries was
performed using the Agatston method.

[Series 2: casc 3.0 bv41 2 bestdiast 72 % · axial · 0.39mm/px · z∈[-160,-88]mm · 4 of 41 slices shown, 5 images]
[im 9/41  vessel]
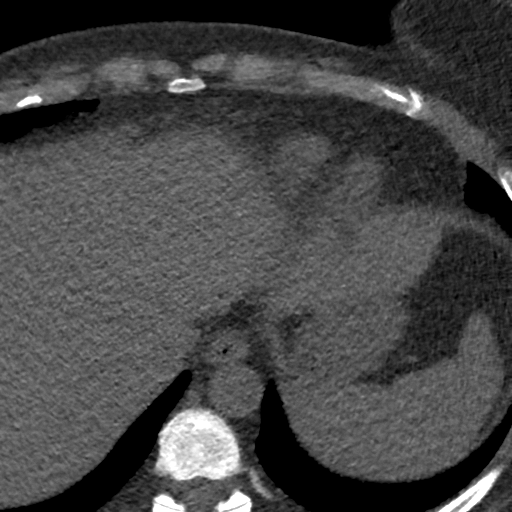
[im 9/41  lung]
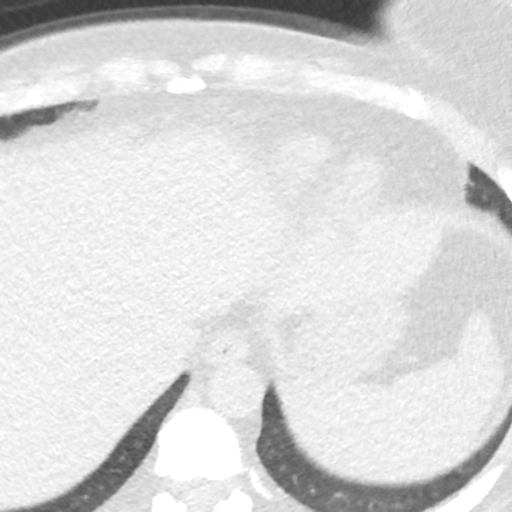
[im 17/41  vessel]
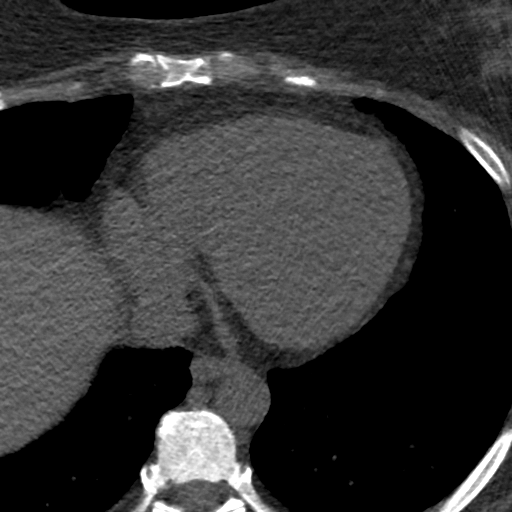
[im 25/41  vessel]
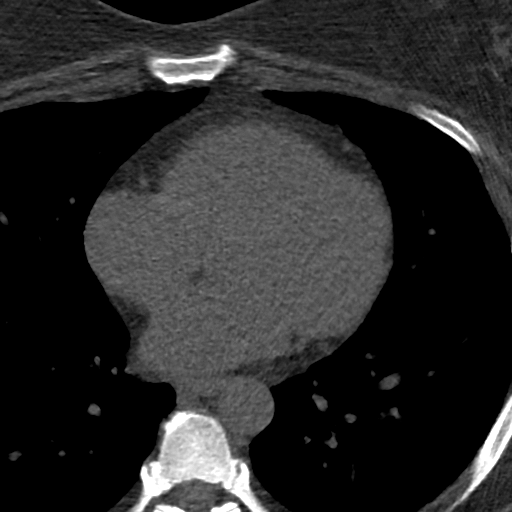
[im 33/41  vessel]
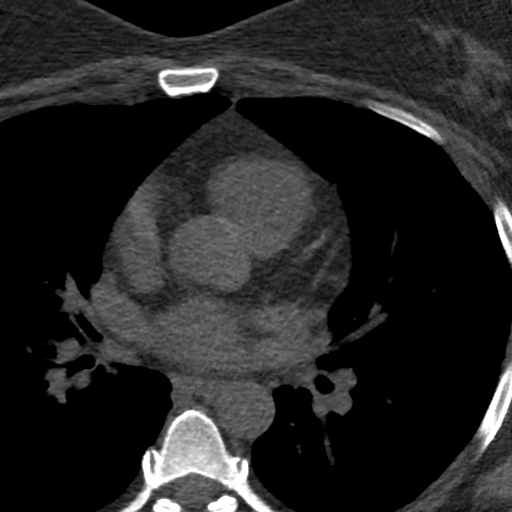

[Series 3: lung 72 % · axial · 0.65mm/px · z∈[-166,-86]mm · 5 of 41 slices shown]
[im 7/41  lung]
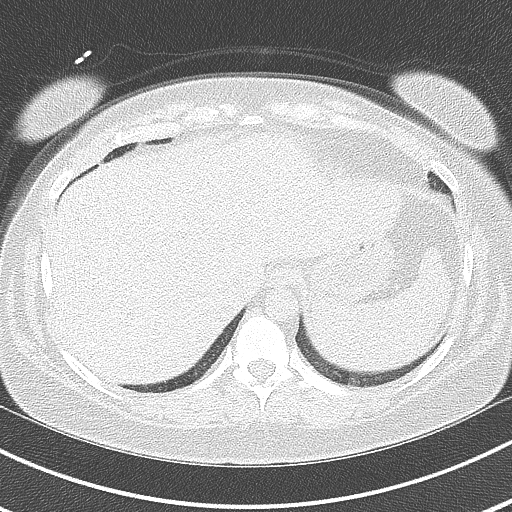
[im 14/41  lung]
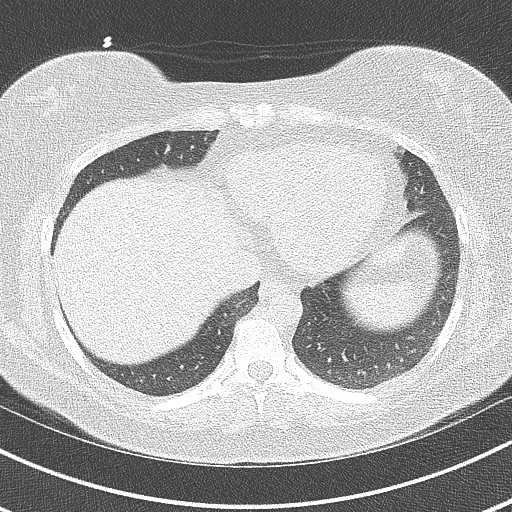
[im 21/41  lung]
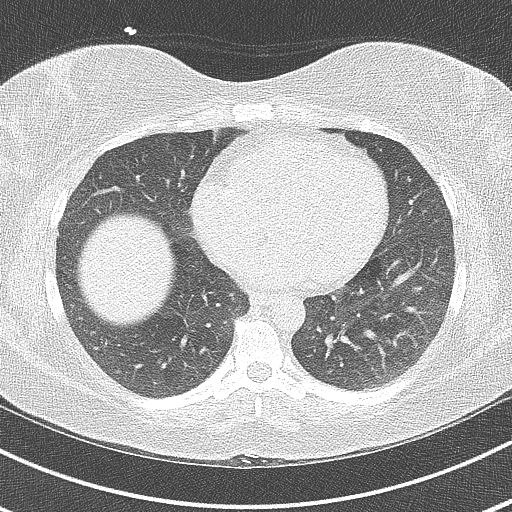
[im 27/41  lung]
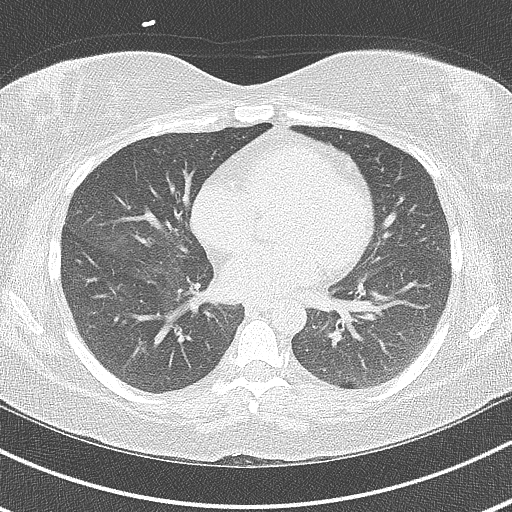
[im 34/41  lung]
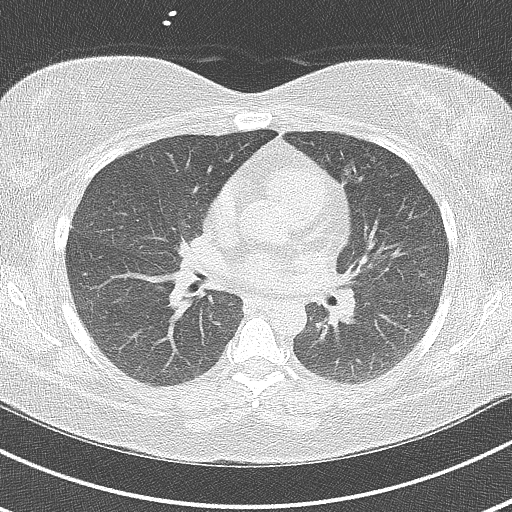

[Series 4: lung st 72 % · axial · 0.65mm/px · z∈[-166,-86]mm · 5 of 41 slices shown]
[im 7/41  lung]
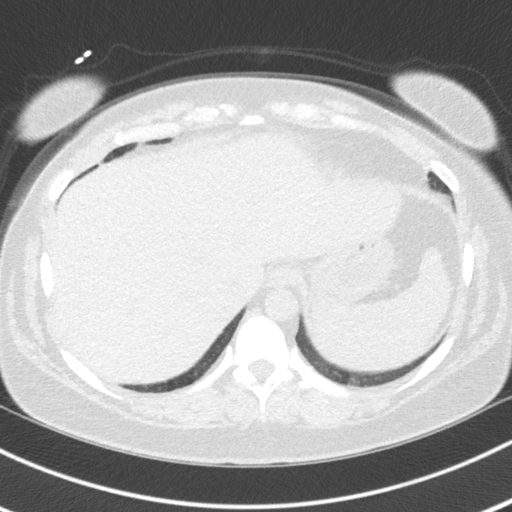
[im 14/41  lung]
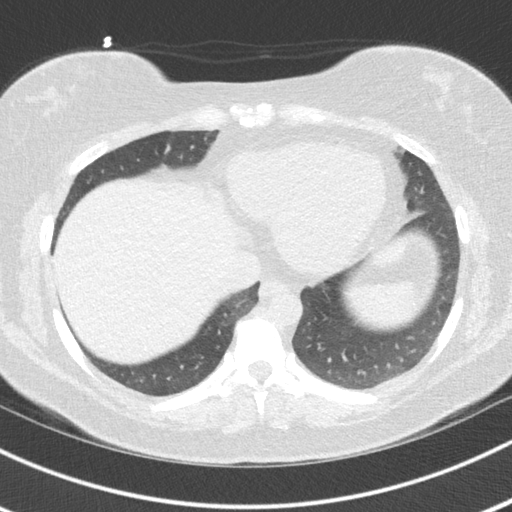
[im 21/41  lung]
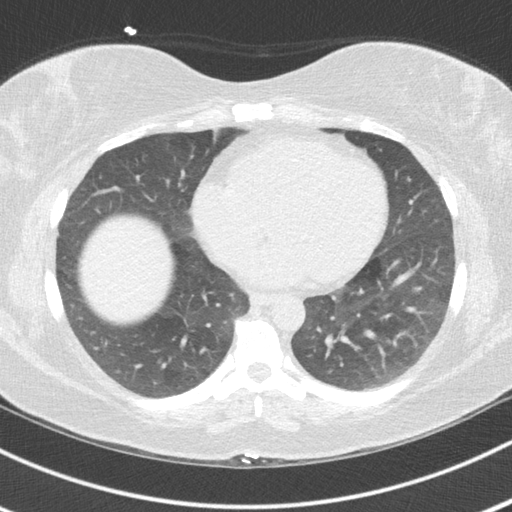
[im 27/41  lung]
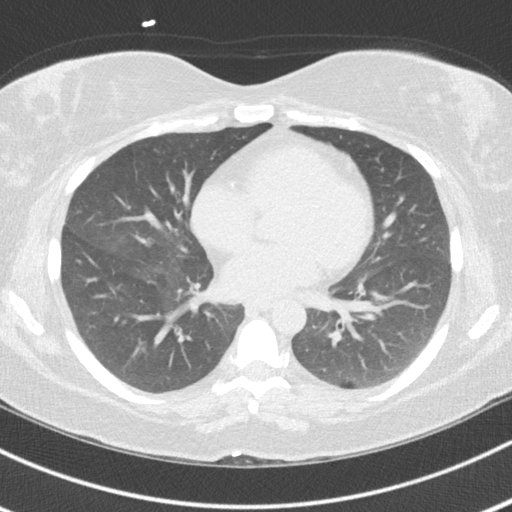
[im 34/41  lung]
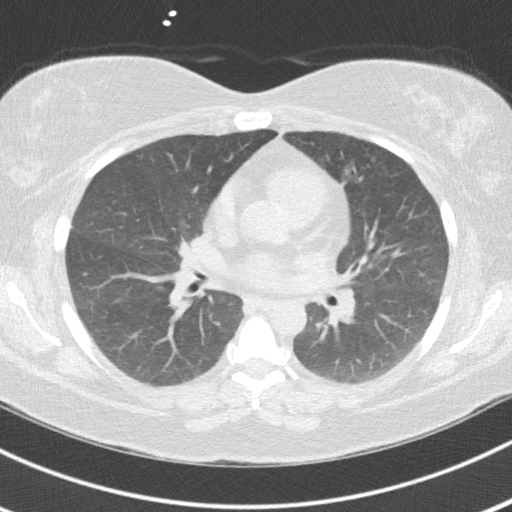

[14 of 20 positions shown; findings below may reference images not displayed]

FINDINGS: Vascular: Normal aortic caliber.

Mediastinum/Nodes: No imaged thoracic adenopathy.

Lungs/Pleura: No pleural fluid. Bilateral upper lobe mild
peribronchovascular interstitial thickening, including on [DATE].

Upper Abdomen: Normal imaged portions of the liver, spleen, stomach.

Musculoskeletal: No acute osseous abnormality.
IMPRESSION: 1.  No acute findings in the imaged extracardiac chest.
2. Mild bilateral upper lobe peribronchovascular interstitial
thickening. Most likely post infectious/inflammatory scarring. If
the patient has chronic shortness of breath, dedicated
high-resolution chest CT may be informative.
FINDINGS: Coronary arteries: Normal origins.

Coronary Calcium Score:

Left main: 0

Left anterior descending artery: 0

Left circumflex artery: 0

Right coronary artery: 0

Total: 0

Percentile: 0

Pericardium: Normal.

Ascending Aorta: Normal caliber.

Non-cardiac: See separate report from [REDACTED].
IMPRESSION: Coronary calcium score of 0. This is a low risk study.



If CAC=0, it is reasonable to withhold statin therapy and reassess
in 5 to 10 years, as long as higher risk conditions are absent
(diabetes mellitus, family history of premature CHD in first degree
relatives (males <55 years; females <65 years), cigarette smoking,
or LDL >=190 mg/dL).

If CAC is 1 to 99, it is reasonable to initiate statin therapy for
patients >=55 years of age.

If CAC is >=100 or >=75th percentile, it is reasonable to initiate
statin therapy at any age.

Cardiology referral should be considered for patients with CAC
scores >=400 or >=75th percentile.

*0541 AHA/ACC/AACVPR/AAPA/ABC/LIENAD/WADDELL/ERXLEBEN/Sri/MANOHARAN/LAMBROU/GAL
Guideline on the Management of Blood Cholesterol: A Report of the
American College of Cardiology/American Heart Association Task Force
on Clinical Practice Guidelines. J Am Coll Cardiol.
7880;73(24):5589-5707.

*** End of Addendum ***
EXAM:
OVER-READ INTERPRETATION  CT CHEST

The following report is an over-read performed by radiologist Dr.
Heyber Restepo [REDACTED] on 04/20/2020. This over-read
does not include interpretation of cardiac or coronary anatomy or
pathology. The calcium score interpretation by the cardiologist is
attached.
FINDINGS: Vascular: Normal aortic caliber.

Mediastinum/Nodes: No imaged thoracic adenopathy.

Lungs/Pleura: No pleural fluid. Bilateral upper lobe mild
peribronchovascular interstitial thickening, including on [DATE].

Upper Abdomen: Normal imaged portions of the liver, spleen, stomach.

Musculoskeletal: No acute osseous abnormality.
IMPRESSION: 1.  No acute findings in the imaged extracardiac chest.
2. Mild bilateral upper lobe peribronchovascular interstitial
thickening. Most likely post infectious/inflammatory scarring. If
the patient has chronic shortness of breath, dedicated
high-resolution chest CT may be informative.

## 2022-11-08 ENCOUNTER — Other Ambulatory Visit (INDEPENDENT_AMBULATORY_CARE_PROVIDER_SITE_OTHER): Payer: Commercial Managed Care - PPO

## 2022-11-08 ENCOUNTER — Encounter: Payer: Self-pay | Admitting: Gastroenterology

## 2022-11-08 ENCOUNTER — Ambulatory Visit: Payer: Commercial Managed Care - PPO | Admitting: Gastroenterology

## 2022-11-08 VITALS — BP 130/82 | HR 93 | Ht 66.0 in | Wt 202.6 lb

## 2022-11-08 DIAGNOSIS — Z1211 Encounter for screening for malignant neoplasm of colon: Secondary | ICD-10-CM | POA: Diagnosis not present

## 2022-11-08 DIAGNOSIS — R1011 Right upper quadrant pain: Secondary | ICD-10-CM

## 2022-11-08 LAB — COMPREHENSIVE METABOLIC PANEL
ALT: 16 U/L (ref 0–35)
AST: 14 U/L (ref 0–37)
Albumin: 3.9 g/dL (ref 3.5–5.2)
Alkaline Phosphatase: 82 U/L (ref 39–117)
BUN: 4 mg/dL — ABNORMAL LOW (ref 6–23)
CO2: 26 meq/L (ref 19–32)
Calcium: 9 mg/dL (ref 8.4–10.5)
Chloride: 105 meq/L (ref 96–112)
Creatinine, Ser: 0.63 mg/dL (ref 0.40–1.20)
GFR: 106.54 mL/min (ref >60.00)
Glucose, Bld: 84 mg/dL (ref 70–99)
Potassium: 4.2 meq/L (ref 3.5–5.1)
Sodium: 139 meq/L (ref 135–145)
Total Bilirubin: 0.3 mg/dL (ref 0.2–1.2)
Total Protein: 6.8 g/dL (ref 6.0–8.3)

## 2022-11-08 MED ORDER — NA SULFATE-K SULFATE-MG SULF 17.5-3.13-1.6 GM/177ML PO SOLN: 1.0000 | Freq: Once | ORAL | 0 refills | Status: AC

## 2022-11-08 NOTE — Patient Instructions (Signed)
Your provider has requested that you go to the basement level for lab work before leaving today. Press "B" on the elevator. The lab is located at the first door on the left as you exit the elevator.  You have been scheduled for an abdominal ultrasound at [facility] (1st floor of hospital) on Wednesday 11/21/22 at 10 am. Please arrive 30 minutes prior to your appointment for registration. Make certain not to have anything to eat or drink 6 hours prior to your appointment. Should you need to reschedule your appointment, please contact radiology at [phone]. This test typically takes about 30 minutes to perform.  _______________________________________________________  If your blood pressure at your visit was 140/90 or greater, please contact your primary care physician to follow up on this.  _______________________________________________________  If you are age 22 or older, your body mass index should be between 23-30. Your Body mass index is 32.7 kg/m. If this is out of the aforementioned range listed, please consider follow up with your Primary Care Provider.  If you are age 55 or younger, your body mass index should be between 19-25. Your Body mass index is 32.7 kg/m. If this is out of the aformentioned range listed, please consider follow up with your Primary Care Provider.   ________________________________________________________  The [place] [facility] providers would like to encourage you to use [facility] to communicate with providers for non-urgent requests or questions.  Due to long hold times on the telephone, sending your provider a message by [facility] may be a faster and more efficient way to get a response.  Please allow 48 business hours for a response.  Please remember that this is for non-urgent requests.  _______________________________________________________

## 2022-11-08 NOTE — Progress Notes (Unsigned)
HPI : Holly [facility] is a 47 y.o. female who is referred to [place] by Edsel Petrin, DO for a low BUN and for colon cancer screening.  She was seen by her gynecologist recently and a CMP showed a BUN level of 4.   She reports symptoms of recurrent pain in the right upper quadrant for the past 2 years.  This pain seems to occur randomly, and will sometimes radiate into the back.  She has episodes of diarrhea and nausea.  She has found that tomatoes reliably precipitate the symptoms, and so has cut these out from her diet with some improvement in her symptoms.  No heartburn or acid regurgitation.  No dysphagia.  Her weight is stable. She usually has bowel movements twice a day.  Stools typically formed and soft.  Sometimes she has hard stools and pain with the passage of stool.  She has seen blood on the toilet paper a few times when she has passed hard stool. No family history of colon cancer. She has never had a colonoscopy or other colon cancer screening.     Past Medical History:  Diagnosis Date   COVID-10 October 2018   Pyelonephritis      Past Surgical History:  Procedure Laterality Date   CATARACT EXTRACTION Right    Family History  Problem Relation Age of Onset   Heart disease Mother    Diabetes Mellitus II Mother    Hypertension Mother    COPD Father    Lung cancer Father    Hypertension Father    Heart disease Maternal Grandmother    Hypertension Maternal Grandmother    Heart disease Maternal Grandfather    Hypertension Maternal Grandfather    Social History   Tobacco Use   Smoking status: Former    Types: Cigarettes   Smokeless tobacco: Never  Vaping Use   Vaping status: Never Used  Substance Use Topics   Alcohol use: Not Currently    Comment: Occasional   Drug use: No   Current Outpatient Medications  Medication Sig Dispense Refill   aspirin EC 81 MG tablet Take 162 mg by mouth once as needed for mild pain or moderate pain.      cholecalciferol (VITAMIN D3) 25 MCG (1000 UNIT) tablet Take 1,000 Units by mouth daily.     Multiple Vitamin (MULTIVITAMIN WITH MINERALS) TABS tablet Take 1 tablet by mouth daily.     No current facility-administered medications for this visit.   Allergies  Allergen Reactions   Metronidazole Nausea Only   Levaquin [Levofloxacin In D5w] Other (See Comments)    Joint/muscle pain     Review of Systems: All systems reviewed and negative except where noted in HPI.    No results found.  Physical Exam: BP 130/82   Pulse 93   Ht 5\' 6"  (1.676 m)   Wt 202 lb 9.6 oz (91.9 kg)   BMI 32.70 kg/m  Constitutional: Pleasant,well-developed, Caucasian female in no acute distress. HEENT: Normocephalic and atraumatic. Conjunctivae are normal. No scleral icterus. Neck supple.  Cardiovascular: Normal rate, regular rhythm.  Pulmonary/chest: Effort normal and breath sounds normal. No wheezing, rales or rhonchi. Abdominal: Soft, nondistended, nontender. Bowel sounds active throughout. There are no masses palpable. No hepatomegaly.  No costovertebral angle tenderness Extremities: no edema Neurological: Alert and oriented to person place and time. Skin: Skin is warm and dry. No rashes noted. Psychiatric: Normal mood and affect. Behavior is normal.  CBC    Component  Value Date/Time   WBC 10.3 05/04/2015 1601   RBC 3.76 (L) 05/04/2015 1601   HGB 12.0 05/04/2015 1601   HCT 34.5 (L) 05/04/2015 1601   PLT 264 05/04/2015 1601   MCV 91.8 05/04/2015 1601   MCH 31.9 05/04/2015 1601   MCHC 34.8 05/04/2015 1601   RDW 13.4 05/04/2015 1601   LYMPHSABS 3.6 05/04/2015 1601   MONOABS 0.5 05/04/2015 1601   EOSABS 0.1 05/04/2015 1601   BASOSABS 0.0 05/04/2015 1601    CMP     Component Value Date/Time   NA 137 05/04/2015 1601   K 3.5 05/04/2015 1601   CL 107 05/04/2015 1601   CO2 21 (L) 05/04/2015 1601   GLUCOSE 115 (H) 05/04/2015 1601   BUN 7 05/04/2015 1601   CREATININE 0.74 05/04/2015 1601    CALCIUM 8.4 (L) 05/04/2015 1601   GFRNONAA >60 05/04/2015 1601   GFRAA >60 05/04/2015 1601       Latest Ref Rng & Units 05/04/2015    4:01 PM 09/10/2014   12:37 PM  CBC EXTENDED  WBC 4.0 - 10.5 K/uL 10.3  14.0   RBC 3.87 - 5.11 MIL/uL 3.76  3.66   Hemoglobin 12.0 - 15.0 g/dL 09.8  11.9   HCT 14.7 - 46.0 % 34.5  33.7   Platelets 150 - 400 K/uL 264  260   NEUT# 1.7 - 7.7 K/uL 6.2    Lymph# 0.7 - 4.0 K/uL 3.6        ASSESSMENT AND PLAN: 47 year old female referred for low BUN levels, due for initial screening colonoscopy.  She has also been having symptoms of right upper quadrant pain.  I reassured the patient that there is no need for concern for a low BUN.  Although sometimes a low BUN can be an indicator of malnutrition, the patient's BMI is 31 she has had no unintentional weight loss.  Otherwise, I do not think there is any further evaluation needs to be done. We discussed the need for colon cancer screening.  The patient initially stated that she would prefer to do a stool based test.  However, given the patient's symptoms of abdominal pain and occasional bright red blood on the toilet paper, a colonoscopy may be more appropriate.  After further discussion, the patient agreed to proceed with a colonoscopy. For her right upper quadrant pain, will evaluate for gallstones with right upper quadrant ultrasound and obtain an H. pylori stool antigen.  Right upper quadrant pain -RUQUS -H. Pylori stool antigen  Colon cancer screening - Colonoscopy  Low BUN - No further evaluation recommended   Edsel Petrin, DO

## 2022-11-11 ENCOUNTER — Ambulatory Visit (AMBULATORY_SURGERY_CENTER): Payer: Commercial Managed Care - PPO | Admitting: Gastroenterology

## 2022-11-11 ENCOUNTER — Encounter: Payer: Self-pay | Admitting: Gastroenterology

## 2022-11-11 ENCOUNTER — Other Ambulatory Visit: Payer: Commercial Managed Care - PPO

## 2022-11-11 VITALS — BP 122/76 | HR 79 | Temp 97.7°F | Resp 16 | Ht 66.0 in | Wt 202.0 lb

## 2022-11-11 DIAGNOSIS — Z1211 Encounter for screening for malignant neoplasm of colon: Secondary | ICD-10-CM | POA: Diagnosis present

## 2022-11-11 DIAGNOSIS — D128 Benign neoplasm of rectum: Secondary | ICD-10-CM

## 2022-11-11 DIAGNOSIS — R1011 Right upper quadrant pain: Secondary | ICD-10-CM

## 2022-11-11 DIAGNOSIS — D123 Benign neoplasm of transverse colon: Secondary | ICD-10-CM

## 2022-11-11 DIAGNOSIS — K635 Polyp of colon: Secondary | ICD-10-CM

## 2022-11-11 MED ORDER — SODIUM CHLORIDE 0.9 % IV SOLN: 500.0000 mL | Freq: Once | INTRAVENOUS | Status: DC

## 2022-11-11 NOTE — Patient Instructions (Signed)
Resume all of your previous medications today as ordered.  Read all of your discharge instructions and handouts.  YOU HAD AN ENDOSCOPIC PROCEDURE TODAY AT THE [facility]:   Refer to the procedure report that was given to you for any specific questions about what was found during the examination.  If the procedure report does not answer your questions, please call your gastroenterologist to clarify.  If you requested that your care partner not be given the details of your procedure findings, then the procedure report has been included in a sealed envelope for you to review at your convenience later.  YOU SHOULD EXPECT: Some feelings of bloating in the abdomen. Passage of more gas than usual.  Walking can help get rid of the air that was put into your GI tract during the procedure and reduce the bloating. If you had a lower endoscopy (such as a colonoscopy or flexible sigmoidoscopy) you may notice spotting of blood in your stool or on the toilet paper. If you underwent a bowel prep for your procedure, you may not have a normal bowel movement for a few days.  Please Note:  You might notice some irritation and congestion in your nose or some drainage.  This is from the oxygen used during your procedure.  There is no need for concern and it should clear up in a day or so.  SYMPTOMS TO REPORT IMMEDIATELY:  Following lower endoscopy (colonoscopy or flexible sigmoidoscopy):  Excessive amounts of blood in the stool  Significant tenderness or worsening of abdominal pains  Swelling of the abdomen that is new, acute  Fever of 100F or higher  For urgent or emergent issues, a gastroenterologist can be reached at any hour by calling [phone]. Do not use MyChart messaging for urgent concerns.    DIET:  We do recommend a small meal at first, but then you may proceed to your regular diet.  Drink plenty of fluids but you should avoid alcoholic beverages for 24 hours.  ACTIVITY:  You should  plan to take it easy for the rest of today and you should NOT DRIVE or use heavy machinery until tomorrow (because of the sedation medicines used during the test).    FOLLOW UP: Our staff will call the number listed on your records the next business day following your procedure.  We will call around 7:15- 8:00 am to check on you and address any questions or concerns that you may have regarding the information given to you following your procedure. If we do not reach you, we will leave a message.     If any biopsies were taken you will be contacted by phone or by letter within the next 1-3 weeks.  Please call us at [phone] if you have not heard about the biopsies in 3 weeks.    SIGNATURES/CONFIDENTIALITY: You and/or your care partner have signed paperwork which will be entered into your electronic medical record.  These signatures attest to the fact that that the information above on your After Visit Summary has been reviewed and is understood.  Full responsibility of the confidentiality of this discharge information lies with you and/or your care-partner.

## 2022-11-11 NOTE — Progress Notes (Signed)
Pt resting comfortably. VSS. Airway intact. SBAR complete to RN. All questions answered.   

## 2022-11-11 NOTE — Progress Notes (Signed)
Called to room to assist during endoscopic procedure.  Patient ID and intended procedure confirmed with present staff. Received instructions for my participation in the procedure from the performing physician.  

## 2022-11-11 NOTE — Progress Notes (Signed)
History and Physical Interval Note:  11/11/2022 10:09 AM  H[facility] Hudson  has presented today for endoscopic procedure(s), with the diagnosis of  Encounter Diagnoses  Name Primary?   RUQ abdominal pain Yes   Screening for colon cancer   .  The various methods of evaluation and treatment have been discussed with the patient and/or family. After consideration of risks, benefits and other options for treatment, the patient has consented to  the endoscopic procedure(s).   The patient's history has been reviewed, patient examined, no change in status, stable for endoscopic procedure(s).  I have reviewed the patient's chart and labs.  Questions were answered to the patient's satisfaction.     Airi Copado E. Tomasa Rand, MD [facility]

## 2022-11-11 NOTE — Op Note (Signed)
[facility] Patient Name: Holly Hudson Procedure Date: 11/11/2022 10:08 AM MRN: [id number] Endoscopist: Lorin Picket E. Tomasa Rand , MD, [id number] Age: 47 Referring MD:  Date of Birth: [birth year] Gender: Female Account #: [id number] Procedure:                Colonoscopy Indications:              Screening for colorectal malignant neoplasm, This                            is the patient's first colonoscopy Medicines:                Monitored Anesthesia Care Procedure:                Pre-Anesthesia Assessment:                           - Prior to the procedure, a History and Physical                            was performed, and patient medications and                            allergies were reviewed. The patient's tolerance of                            previous anesthesia was also reviewed. The risks                            and benefits of the procedure and the sedation                            options and risks were discussed with the patient.                            All questions were answered, and informed consent                            was obtained. Prior Anticoagulants: The patient has                            taken no anticoagulant or antiplatelet agents. ASA                            Grade Assessment: I - A normal, healthy patient.                            After reviewing the risks and benefits, the patient                            was deemed in satisfactory condition to undergo the                            procedure.  After obtaining informed consent, the colonoscope                            was passed under direct vision. Throughout the                            procedure, the patient's blood pressure, pulse, and                            oxygen saturations were monitored continuously. The                            CF [id number] #[id number] was introduced through the anus                            and advanced to the  the terminal ileum, with                            identification of the appendiceal orifice and IC                            valve. The colonoscopy was performed without                            difficulty. The patient tolerated the procedure                            well. The quality of the bowel preparation was                            good. The terminal ileum, ileocecal valve,                            appendiceal orifice, and rectum were photographed.                            The bowel preparation used was SUPREP via split                            dose instruction. Scope In: 10:16:14 AM Scope Out: 10:31:10 AM Scope Withdrawal Time: 0 hours 12 minutes 3 seconds  Total Procedure Duration: 0 hours 14 minutes 56 seconds  Findings:                 The perianal and digital rectal examinations were                            normal. Pertinent negatives include normal                            sphincter tone and no palpable rectal lesions.                           A 2 mm polyp was found in the splenic flexure. The  polyp was sessile. The polyp was removed with a                            cold snare. Resection and retrieval were complete.                            Estimated blood loss was minimal.                           Three sessile polyps were found in the rectum. The                            polyps were 3 to 4 mm in size. These polyps were                            removed with a cold snare. Resection and retrieval                            were complete.                           A single small angiodysplastic lesion without                            bleeding was found at the ileocecal valve.                           A single medium-mouthed diverticulum was found in                            the descending colon.                           The exam was otherwise normal throughout the                            examined colon.                            The terminal ileum appeared normal.                           Anal papilla(e) were hypertrophied.                           The retroflexed view of the distal rectum and anal                            verge was normal and showed no anal or rectal                            abnormalities. Complications:            No immediate complications. Estimated Blood Loss:     Estimated blood loss was minimal. Impression:               -  One 2 mm polyp at the splenic flexure, removed                            with a cold snare. Resected and retrieved.                           - Three 3 to 4 mm polyps in the rectum, removed                            with a cold snare. Resected and retrieved.                           - A single non-bleeding colonic angiodysplastic                            lesion.                           - Diverticulosis in the descending colon.                           - The examined portion of the ileum was normal.                           - Anal papilla(e) were hypertrophied.                           - The distal rectum and anal verge are normal on                            retroflexion view. Recommendation:           - Patient has a contact number available for                            emergencies. The signs and symptoms of potential                            delayed complications were discussed with the                            patient. Return to normal activities tomorrow.                            Written discharge instructions were provided to the                            patient.                           - Resume previous diet.                           - Continue present medications.                           -  Await pathology results.                           - Repeat colonoscopy (date not yet determined) for                            surveillance based on pathology results. Sinia Antosh E. Tomasa Rand, MD 11/11/2022 10:42:12 AM This report  has been signed electronically.

## 2022-11-12 ENCOUNTER — Telehealth: Payer: Self-pay | Admitting: *Deleted

## 2022-11-12 NOTE — Telephone Encounter (Signed)
  Follow up Call-     11/11/2022    9:36 AM  Call back number  Post procedure Call Back phone  # [phone]  Permission to leave phone message Yes     Patient questions:  Do you have a fever, pain , or abdominal swelling? No. Pain Score  0 *  Have you tolerated food without any problems? Yes.    Have you been able to return to your normal activities? Yes.    Do you have any questions about your discharge instructions: Diet   No. Medications  No. Follow up visit  No.  Do you have questions or concerns about your Care? No.  Actions: * If pain score is 4 or above: No action needed, pain <4.

## 2022-11-13 ENCOUNTER — Ambulatory Visit (HOSPITAL_COMMUNITY)
Admission: RE | Admit: 2022-11-13 | Discharge: 2022-11-13 | Disposition: A | Discharge status: Home / Self Care | Payer: Commercial Managed Care - PPO | Source: Ambulatory Visit | Attending: Gastroenterology | Admitting: Gastroenterology

## 2022-11-13 DIAGNOSIS — R1011 Right upper quadrant pain: Secondary | ICD-10-CM | POA: Diagnosis present

## 2022-11-13 LAB — H. PYLORI ANTIGEN, STOOL: H pylori Ag, Stl: NEGATIVE

## 2022-11-13 NOTE — Progress Notes (Signed)
Ms. Digrazia, Your right upper quadrant ultrasound did not show any evidence of gallstones or other pathology that would explain right upper quadrant pain.  There was some fatty change to your liver.  This is very common and is typically related to diet and exercise habits.  Most patients with fatty liver do not go on to develop any significant liver disease in their lifetime.  However, a small percentage of patients can develop significant liver disease to include cirrhosis.  Treatment for fatty liver involves improving diet and exercise habits and weight loss.  Avoiding alcohol is important.  Your stool test for H. pylori was also negative.  If you still continue to have issues with the abdominal pain, we can consider a CT scan to further evaluate for kidney stones, or consider an upper endoscopy.  Please contact our office if you continue to have significant abdominal pain.

## 2022-11-15 LAB — SURGICAL PATHOLOGY

## 2022-11-18 ENCOUNTER — Other Ambulatory Visit: Payer: Self-pay | Admitting: Obstetrics and Gynecology

## 2022-11-18 DIAGNOSIS — R928 Other abnormal and inconclusive findings on diagnostic imaging of breast: Secondary | ICD-10-CM

## 2022-11-18 NOTE — Progress Notes (Signed)
Ms. Carreon,  Good news: the polyps that I removed during your recent examination were NOT precancerous.  You should continue to follow current colorectal cancer screening guidelines with a repeat colonoscopy in 10 years.    If you develop any new rectal bleeding, abdominal pain or significant bowel habit changes, please contact me before then.

## 2022-12-03 ENCOUNTER — Ambulatory Visit
Admission: RE | Admit: 2022-12-03 | Discharge: 2022-12-03 | Disposition: A | Discharge status: Home / Self Care | Payer: Commercial Managed Care - PPO | Source: Ambulatory Visit | Attending: Obstetrics and Gynecology | Admitting: Obstetrics and Gynecology

## 2022-12-03 ENCOUNTER — Ambulatory Visit: Payer: Commercial Managed Care - PPO

## 2022-12-03 DIAGNOSIS — R928 Other abnormal and inconclusive findings on diagnostic imaging of breast: Secondary | ICD-10-CM

## 2023-07-01 ENCOUNTER — Ambulatory Visit: Payer: Self-pay | Admitting: Gastroenterology

## 2023-07-02 ENCOUNTER — Other Ambulatory Visit: Payer: Self-pay

## 2023-07-02 DIAGNOSIS — R1084 Generalized abdominal pain: Secondary | ICD-10-CM
